# Patient Record
Sex: Female | Born: 1995 | Race: Black or African American | Hispanic: No | Marital: Single | Smoking: Current every day smoker
Health system: Southern US, Community
[De-identification: ages and names within clinical notes are randomized; demographics above are authoritative.]

---

## 2017-04-24 ENCOUNTER — Emergency Department
Admission: EM | Admit: 2017-04-24 | Discharge: 2017-04-24 | Disposition: A | Discharge status: Home / Self Care | Payer: Self-pay | Attending: Emergency Medicine | Admitting: Emergency Medicine

## 2017-04-24 ENCOUNTER — Emergency Department: Payer: Self-pay

## 2017-04-24 ENCOUNTER — Encounter: Payer: Self-pay | Admitting: Emergency Medicine

## 2017-04-24 DIAGNOSIS — N3000 Acute cystitis without hematuria: Secondary | ICD-10-CM | POA: Insufficient documentation

## 2017-04-24 DIAGNOSIS — F17210 Nicotine dependence, cigarettes, uncomplicated: Secondary | ICD-10-CM | POA: Insufficient documentation

## 2017-04-24 DIAGNOSIS — R590 Localized enlarged lymph nodes: Secondary | ICD-10-CM | POA: Insufficient documentation

## 2017-04-24 LAB — URINALYSIS, COMPLETE (UACMP) WITH MICROSCOPIC
Bilirubin Urine: NEGATIVE
GLUCOSE, UA: NEGATIVE mg/dL
Hgb urine dipstick: NEGATIVE
Ketones, ur: 20 mg/dL — AB
Nitrite: NEGATIVE
PROTEIN: 100 mg/dL — AB
Specific Gravity, Urine: 1.027 (ref 1.005–1.030)
pH: 5 (ref 5.0–8.0)

## 2017-04-24 LAB — COMPREHENSIVE METABOLIC PANEL
ALBUMIN: 4.4 g/dL (ref 3.5–5.0)
ALT: 13 U/L — ABNORMAL LOW (ref 14–54)
AST: 28 U/L (ref 15–41)
Alkaline Phosphatase: 41 U/L (ref 38–126)
Anion gap: 9 (ref 5–15)
BILIRUBIN TOTAL: 0.6 mg/dL (ref 0.3–1.2)
BUN: 7 mg/dL (ref 6–20)
CHLORIDE: 101 mmol/L (ref 101–111)
CO2: 24 mmol/L (ref 22–32)
Calcium: 9.2 mg/dL (ref 8.9–10.3)
Creatinine, Ser: 0.54 mg/dL (ref 0.44–1.00)
GFR calc Af Amer: >60 mL/min (ref >60)
GFR calc non Af Amer: >60 mL/min (ref >60)
GLUCOSE: 96 mg/dL (ref 65–99)
POTASSIUM: 3.5 mmol/L (ref 3.5–5.1)
Sodium: 134 mmol/L — ABNORMAL LOW (ref 135–145)
TOTAL PROTEIN: 8.4 g/dL — AB (ref 6.5–8.1)

## 2017-04-24 LAB — CBC WITH DIFFERENTIAL/PLATELET
Basophils Absolute: 0 10*3/uL (ref 0–0.1)
Basophils Relative: 1 %
Eosinophils Absolute: 0 10*3/uL (ref 0–0.7)
Eosinophils Relative: 0 %
HEMATOCRIT: 30.4 % — AB (ref 35.0–47.0)
Hemoglobin: 9.6 g/dL — ABNORMAL LOW (ref 12.0–16.0)
LYMPHS ABS: 1.6 10*3/uL (ref 1.0–3.6)
LYMPHS PCT: 30 %
MCH: 20.2 pg — AB (ref 26.0–34.0)
MCHC: 31.4 g/dL — AB (ref 32.0–36.0)
MCV: 64.4 fL — AB (ref 80.0–100.0)
MONO ABS: 0.7 10*3/uL (ref 0.2–0.9)
Monocytes Relative: 13 %
NEUTROS ABS: 3.1 10*3/uL (ref 1.4–6.5)
Neutrophils Relative %: 56 %
Platelets: 142 10*3/uL — ABNORMAL LOW (ref 150–440)
RBC: 4.72 MIL/uL (ref 3.80–5.20)
RDW: 20.1 % — AB (ref 11.5–14.5)
WBC: 5.4 10*3/uL (ref 3.6–11.0)

## 2017-04-24 LAB — POCT PREGNANCY, URINE: Preg Test, Ur: NEGATIVE

## 2017-04-24 LAB — MONONUCLEOSIS SCREEN: Mono Screen: NEGATIVE

## 2017-04-24 MED ORDER — IOPAMIDOL (ISOVUE-300) INJECTION 61%
100.0000 mL | Freq: Once | INTRAVENOUS | Status: AC | PRN
  Administered 2017-04-24: 100 mL via INTRAVENOUS
  Filled 2017-04-24: qty 100

## 2017-04-24 MED ORDER — IOPAMIDOL (ISOVUE-300) INJECTION 61%
30.0000 mL | Freq: Once | INTRAVENOUS | Status: AC
  Administered 2017-04-24: 30 mL via ORAL
  Filled 2017-04-24: qty 30

## 2017-04-24 MED ORDER — SULFAMETHOXAZOLE-TRIMETHOPRIM 800-160 MG PO TABS: 1.0000 | ORAL_TABLET | Freq: Two times a day (BID) | ORAL | 0 refills | Status: AC

## 2017-04-24 NOTE — ED Provider Notes (Signed)
[facility] Emergency Department Provider Note   ____________________________________________   I have reviewed the triage vital signs and the nursing notes.   HISTORY  Chief Complaint Abscess    HPI Barbara Daniel is a 21 y.o. female presents to the emergency department with palpable nodules along bilateral groin, general malaise, low-grade fever, back pain and abdominal pain that began approximately 2 weeks ago. Patient is unsure of the likelihood if she is pregnant. Patient denies any sick contacts or recent illnesses. Patient denies headache, vision changes, chest pain, chest tightness, shortness of breath, nausea and vomiting.  History reviewed. No pertinent past medical history.  There are no active problems to display for this patient.   History reviewed. No pertinent surgical history.  Prior to Admission medications   Medication Sig Start Date End Date Taking? Authorizing Provider  sulfamethoxazole-trimethoprim (BACTRIM DS,SEPTRA DS) 800-160 MG tablet Take 1 tablet by mouth 2 (two) times daily. 04/24/17 05/04/17  Yazmine Sorey, Jordan Likes, PA-C    Allergies Patient has no known allergies.  No family history on file.  Social History Social History  Substance Use Topics  . Smoking status: Current Every Day Smoker    Types: Cigarettes  . Smokeless tobacco: Never Used  . Alcohol use No    Review of Systems Constitutional: Positive for Low-grade fever. Eyes: No visual changes. ENT:  Negative for sore throat and for difficulty swallowing Cardiovascular: Denies chest pain. Respiratory: Denies cough. Denies shortness of breath. Gastrointestinal: Left upper quadrant and right lower quadrant abdominal pain. Genitourinary: Negative for dysuria. Musculoskeletal: Positive for generalized body aches and back pain Skin: Negative for rash. Probable nodules bilateral groin with tenderness. Neurological: Negative for headaches.   ____________________________________________   PHYSICAL EXAM:  VITAL SIGNS: ED Triage Vitals  Enc Vitals Group     BP 04/24/17 1723 104/70     Pulse Rate 04/24/17 1723 (!) 109     Resp 04/24/17 1723 16     Temp 04/24/17 1723 (!) 100.6 F (38.1 C)     Temp Source 04/24/17 1723 Oral     SpO2 04/24/17 1723 100 %     Weight 04/24/17 1724 122 lb (55.3 kg)     Height 04/24/17 1724 5\' 3"  (1.6 m)     Head Circumference --      Peak Flow --      Pain Score 04/24/17 1723 6     Pain Loc --      Pain Edu? --      Excl. in GC? --     Constitutional: Alert and oriented. Well appearing and in no acute distress.  Head: Normocephalic and atraumatic. Eyes: Conjunctivae are normal. PERRL. Ears: Canals clear. Nose: No congestion/rhinorrhea Mouth/Throat: Mucous membranes are moist. Oropharynx clear without erythema Tonsils symmetrical bilaterally Neck: Supple.  Hematological/Lymphatic/Immunological: Inguinal lymphadenopathy bilaterally Cardiovascular: Normal rate, regular rhythm. Normal distal pulses. Respiratory: Normal respiratory effort. No wheezes/rales/rhonchi. Lungs CTAB. Gastrointestinal: Left upper quadrant and right lower quadrant tenderness to palpation. Negative distention. Positive bowel sounds in all quadrants. CVA tenderness bilaterally Musculoskeletal: Nontender with normal range of motion in all extremities. Global back pain with increase spine pain with cervical flexion chin to the chest. Neurologic: Normal speech and language. No gross focal neurologic deficits are appreciated. No sensory loss or abnormal reflexes.  Skin:  Skin is warm, dry and intact. No rash noted. Psychiatric: Mood and affect are normal.  ____________________________________________   LABS (all labs ordered are listed, but only abnormal results are displayed)  Labs  Reviewed  URINALYSIS, COMPLETE (UACMP) WITH MICROSCOPIC - Abnormal; Notable for the following:       Result Value   Color, Urine AMBER (*)     APPearance HAZY (*)    Ketones, ur 20 (*)    Protein, ur 100 (*)    Leukocytes, UA LARGE (*)    Bacteria, UA FEW (*)    Squamous Epithelial / LPF 0-5 (*)    All other components within normal limits  COMPREHENSIVE METABOLIC PANEL - Abnormal; Notable for the following:    Sodium 134 (*)    Total Protein 8.4 (*)    ALT 13 (*)    All other components within normal limits  CBC WITH DIFFERENTIAL/PLATELET - Abnormal; Notable for the following:    Hemoglobin 9.6 (*)    HCT 30.4 (*)    MCV 64.4 (*)    MCH 20.2 (*)    MCHC 31.4 (*)    RDW 20.1 (*)    Platelets 142 (*)    All other components within normal limits  MONONUCLEOSIS SCREEN  CBC WITH DIFFERENTIAL/PLATELET  POC URINE PREG, ED  POCT PREGNANCY, URINE   ____________________________________________  EKG None ____________________________________________  RADIOLOGY CT abdomen and pelvis with contrast IMPRESSION: Small bilateral inguinal lymph nodes, left greater than right, the largest approximately 13 mm short axis on the left and may be reactive in etiology. ____________________________________________   PROCEDURES  Procedure(s) performed: no    Critical Care performed: no ____________________________________________   INITIAL IMPRESSION / ASSESSMENT AND PLAN / ED COURSE  Pertinent labs & imaging results that were available during my care of the patient were reviewed by me and considered in my medical decision making (see chart for details).  Patient presented with general malaise, inguinal lymphadenopathy, low-grade fever, back pain, left upper quadrant and right lower quadrant abdominal pain that had persisted for approximately 2 weeks. History, physical exam findings, labs and imaging are reassuring symptoms are consistent with acute cystitis with likely reactive inguinal lymphadenopathy. Patient later reported she has a history of chronic anemia explaining CBC lab findings. Patient will be given Bactrim for  an anabolic coverage. Patient encouraged to increase her hydration and modify activity until her symptoms improved. Patient informed of clinical course, understand medical decision-making process, and agree with plan.  Patient was advised to follow up with and was also advised to return to the emergency department for symptoms that change or worsen.      ____________________________________________   FINAL CLINICAL IMPRESSION(S) / ED DIAGNOSES  Final diagnoses:  Acute cystitis without hematuria  Inguinal lymphadenopathy       NEW MEDICATIONS STARTED DURING THIS VISIT:  New Prescriptions   SULFAMETHOXAZOLE-TRIMETHOPRIM (BACTRIM DS,SEPTRA DS) 800-160 MG TABLET    Take 1 tablet by mouth 2 (two) times daily.     Note:  This document was prepared using Dragon voice recognition software and may include unintentional dictation errors.    Clois ComberLittle, Davian Hanshaw M, PA-C 04/24/17 2225    Jeanmarie PlantMcShane, James A, MD 04/25/17 Marlyne [id number]

## 2017-04-24 NOTE — ED Notes (Signed)
See triage note  States she noticed a "knot" to left groin about 1-2 weeks ago  Area is sore to touch thinks the area is larger today

## 2017-04-24 NOTE — ED Triage Notes (Signed)
A possible to left groin area for 2 weeks

## 2018-01-05 IMAGING — CT CT ABD-PELV W/ CM
2 of 4 series · 15 of 46 positions shown, 17 images · IV contrast (APPLIED)
Comparison: None.

CLINICAL DATA: Pain and knot in the left groin starting 1-2 weeks
ago.

EXAM:
CT ABDOMEN AND PELVIS WITH CONTRAST
TECHNIQUE: Multidetector CT imaging of the abdomen and pelvis was performed
using the standard protocol following bolus administration of
intravenous contrast.
CONTRAST:  100mL DMESN4-399 IOPAMIDOL (DMESN4-399) INJECTION 61%

[Series 2: routine abd/pel with · axial · 0.58mm/px · z∈[-871,-501]mm · 12 of 85 slices shown, 14 images]
[im 7/85  soft-tissue]
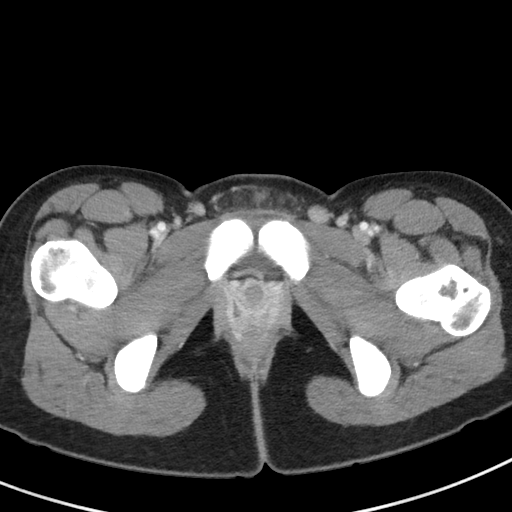
[im 7/85  bone]
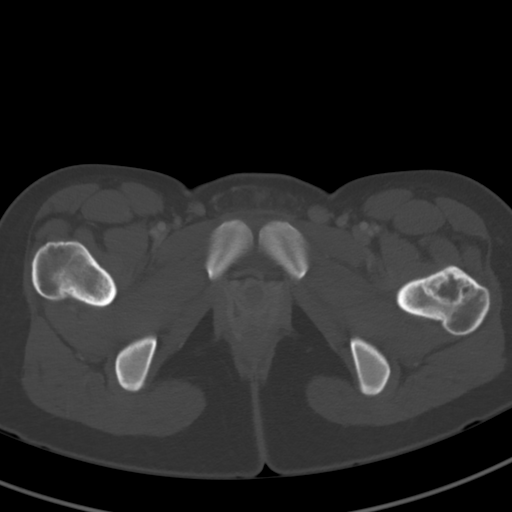
[im 14/85  soft-tissue]
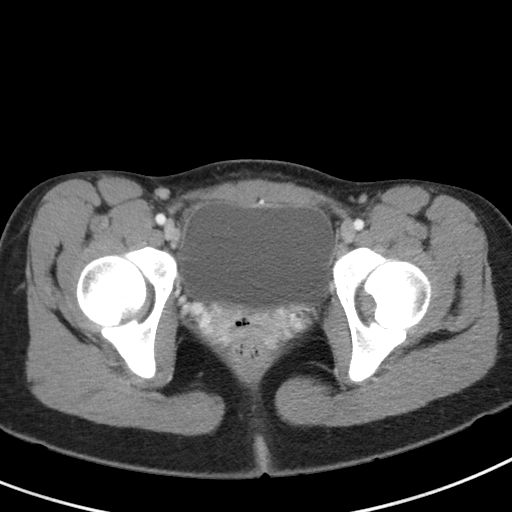
[im 21/85  soft-tissue]
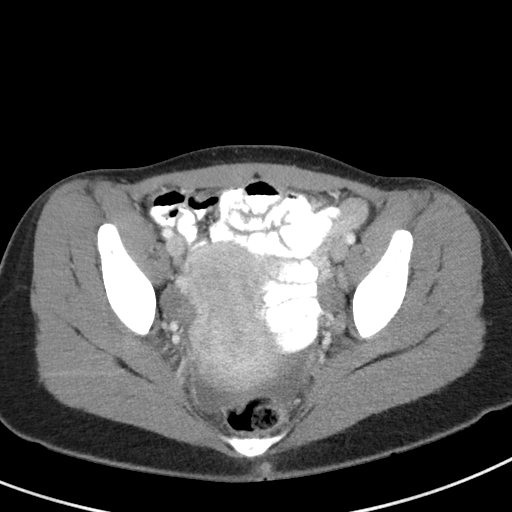
[im 27/85  soft-tissue]
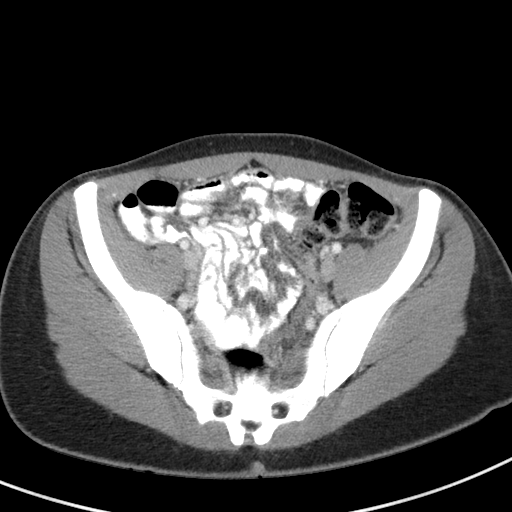
[im 34/85  soft-tissue]
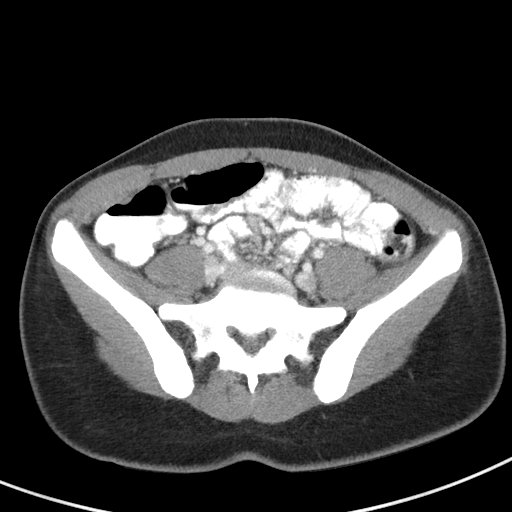
[im 41/85  soft-tissue]
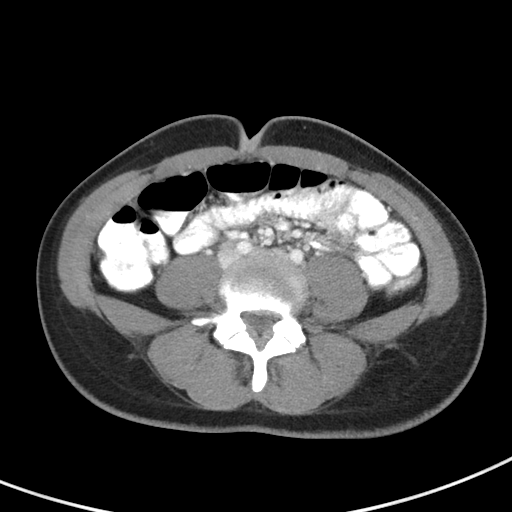
[im 48/85  soft-tissue]
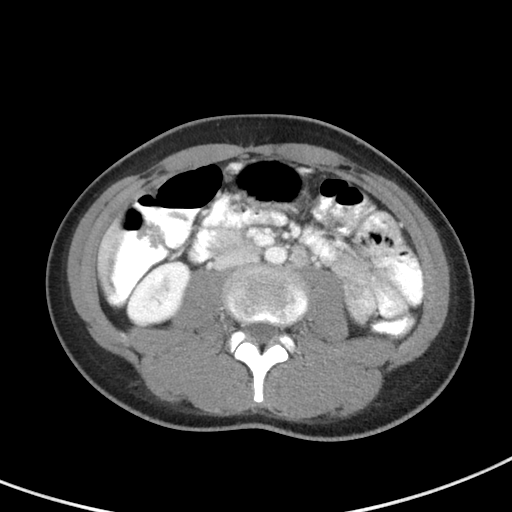
[im 54/85  soft-tissue]
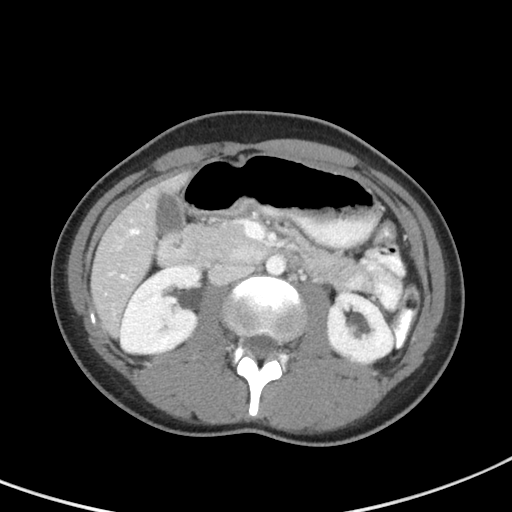
[im 61/85  soft-tissue]
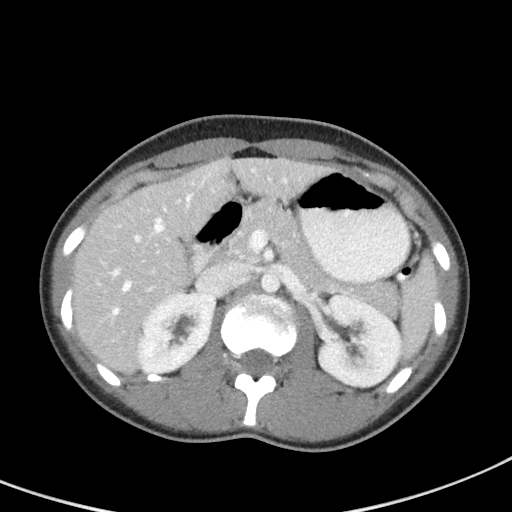
[im 61/85  bone]
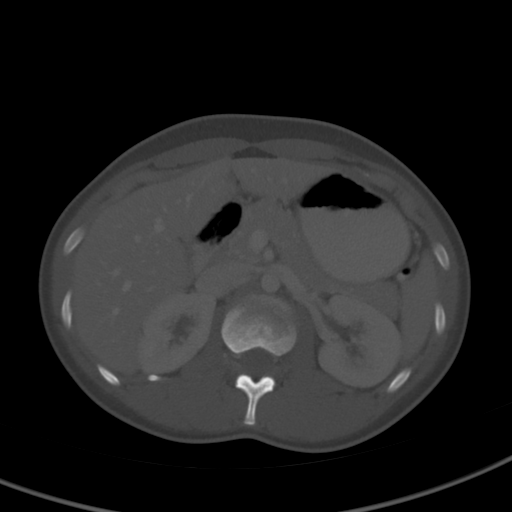
[im 68/85  soft-tissue]
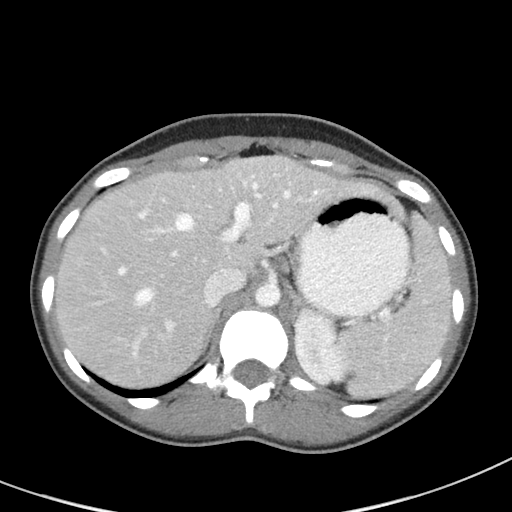
[im 74/85  soft-tissue]
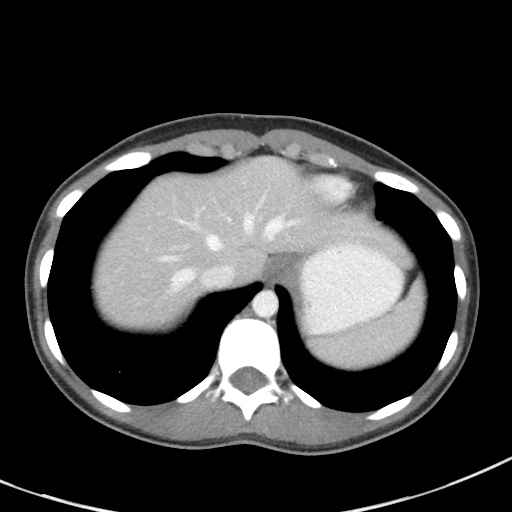
[im 81/85  soft-tissue]
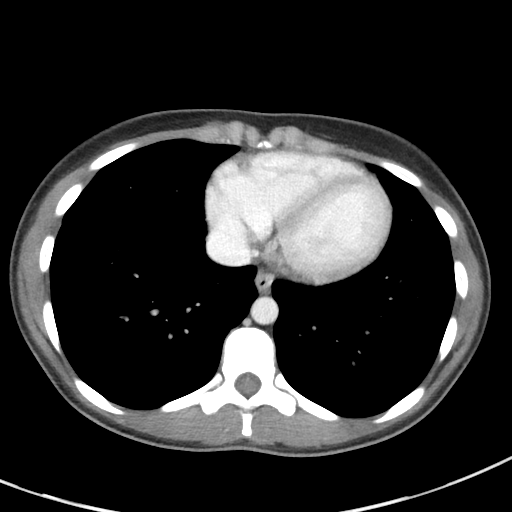

[Series 5: coronal st · coronal · 0.59mm/px · 3 of 68 slices shown]
[im 23/68  soft-tissue]
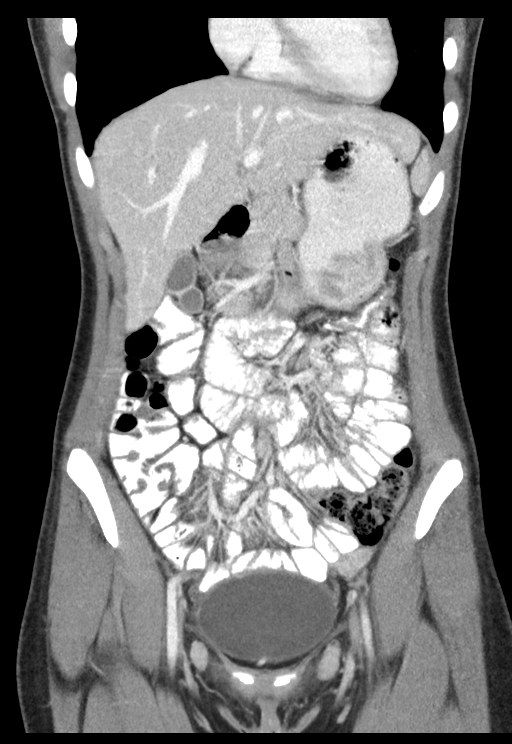
[im 30/68  soft-tissue]
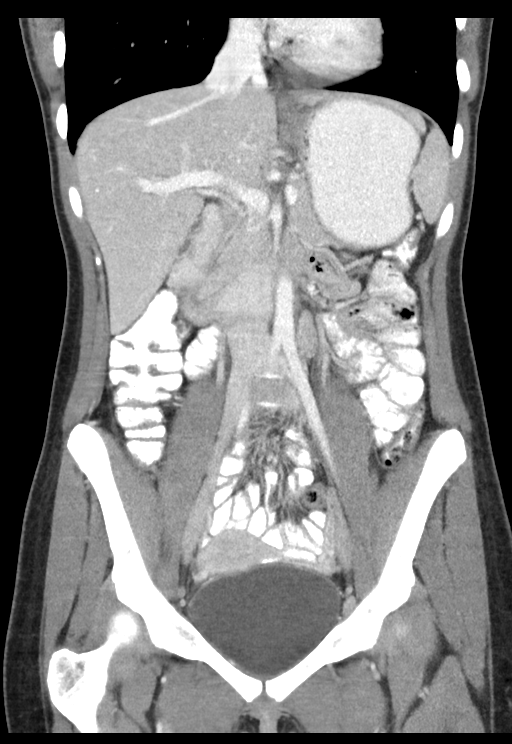
[im 38/68  soft-tissue]
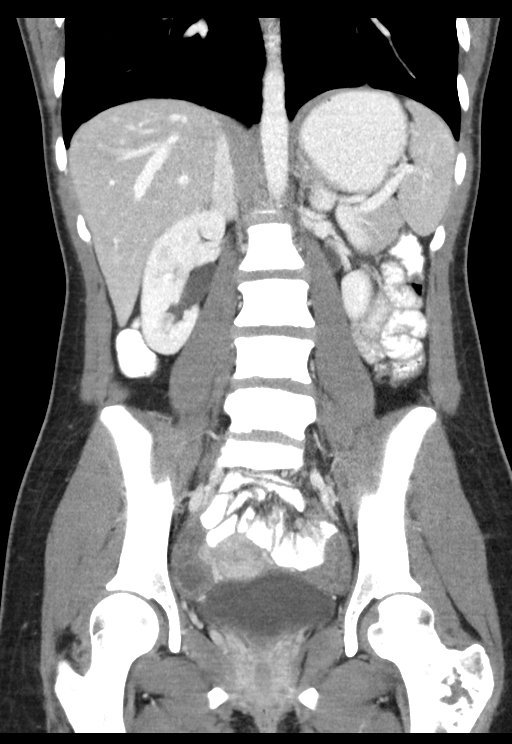

[15 of 46 positions shown; findings below may reference images not displayed]

FINDINGS: LOWER CHEST: Lung bases are clear. Included heart size is normal. No
pericardial effusion.

HEPATOBILIARY: Liver and gallbladder are normal.

PANCREAS: Normal.

SPLEEN: Normal.

ADRENALS/URINARY TRACT: Kidneys are orthotopic, demonstrating
symmetric enhancement. No nephrolithiasis, hydronephrosis or solid
renal masses. The unopacified ureters are normal in course and
caliber. Delayed imaging through the kidneys demonstrates symmetric
prompt contrast excretion within the proximal urinary collecting
system. Urinary bladder is partially distended and unremarkable.
Normal adrenal glands.

STOMACH/BOWEL: The stomach, small and large bowel are normal in
course and caliber without inflammatory changes. Normal appendix.

VASCULAR/LYMPHATIC: Aortoiliac vessels are normal in course and
caliber. Small bilateral inguinal lymph nodes measuring up to 13 mm
short axis on the left and 9 mm on the right. These may be reactive
lymph nodes.

REPRODUCTIVE: Uterus and both adnexa appear unremarkable.
Physiologic sized follicle on the right measuring 17 mm. Small
amount of physiologic free fluid in the cul-de-sac.

OTHER: No intraperitoneal free fluid or free air.

MUSCULOSKELETAL: Nonacute.
IMPRESSION: Small bilateral inguinal lymph nodes, left greater than right, the
largest approximately 13 mm short axis on the left and may be
reactive in etiology.
